# Patient Record
Sex: Male | Born: 1965 | Race: White | Hispanic: No | Marital: Single
Health system: Southern US, Community
[De-identification: ages and names within clinical notes are randomized; demographics above are authoritative.]

---

## 2004-03-07 ENCOUNTER — Other Ambulatory Visit: Payer: Self-pay

## 2006-02-22 ENCOUNTER — Other Ambulatory Visit: Payer: Self-pay

## 2006-02-22 ENCOUNTER — Emergency Department: Payer: Self-pay | Admitting: Emergency Medicine

## 2008-08-05 ENCOUNTER — Emergency Department: Payer: Self-pay | Admitting: Emergency Medicine

## 2013-01-28 ENCOUNTER — Emergency Department: Payer: Self-pay | Admitting: Emergency Medicine

## 2013-01-28 LAB — URINALYSIS, COMPLETE
Ketone: NEGATIVE
Leukocyte Esterase: NEGATIVE
Nitrite: NEGATIVE
Ph: 5 (ref 4.5–8.0)
Protein: NEGATIVE
RBC,UR: 1 /HPF (ref 0–5)
Specific Gravity: >1.06 (ref 1.003–1.030)
Squamous Epithelial: <1
WBC UR: <1 /HPF (ref 0–5)

## 2013-01-28 LAB — CBC
HCT: 45.3 % (ref 40.0–52.0)
Platelet: 324 10*3/uL (ref 150–440)
RBC: 4.93 10*6/uL (ref 4.40–5.90)
WBC: 13.3 10*3/uL — ABNORMAL HIGH (ref 3.8–10.6)

## 2013-01-28 LAB — COMPREHENSIVE METABOLIC PANEL
Albumin: 4.1 g/dL (ref 3.4–5.0)
BUN: 11 mg/dL (ref 7–18)
Bilirubin,Total: 0.4 mg/dL (ref 0.2–1.0)
Calcium, Total: 8.8 mg/dL (ref 8.5–10.1)
Creatinine: 0.85 mg/dL (ref 0.60–1.30)
EGFR (Non-African Amer.): >60
Glucose: 96 mg/dL (ref 65–99)
Osmolality: 275 (ref 275–301)
Potassium: 3.6 mmol/L (ref 3.5–5.1)
SGPT (ALT): 76 U/L (ref 12–78)
Sodium: 138 mmol/L (ref 136–145)
Total Protein: 8.2 g/dL (ref 6.4–8.2)

## 2013-01-28 LAB — LIPASE, BLOOD: Lipase: 217 U/L (ref 73–393)

## 2013-01-28 LAB — TROPONIN I
Troponin-I: <0.02 ng/mL
Troponin-I: <0.02 ng/mL

## 2013-01-28 LAB — DRUG SCREEN, URINE
Amphetamines, Ur Screen: NEGATIVE (ref ?–1000)
Barbiturates, Ur Screen: NEGATIVE (ref ?–200)
Cannabinoid 50 Ng, Ur ~~LOC~~: POSITIVE (ref ?–50)
Cocaine Metabolite,Ur ~~LOC~~: NEGATIVE (ref ?–300)
MDMA (Ecstasy)Ur Screen: NEGATIVE (ref ?–500)
Methadone, Ur Screen: NEGATIVE (ref ?–300)
Phencyclidine (PCP) Ur S: NEGATIVE (ref ?–25)

## 2013-01-28 LAB — PROTIME-INR: INR: 0.9

## 2013-01-28 LAB — CK TOTAL AND CKMB (NOT AT ARMC): CK-MB: 3.8 ng/mL — ABNORMAL HIGH (ref 0.5–3.6)

## 2014-03-11 IMAGING — CR DG CHEST 2V
1 series · 2 of 2 positions shown · non-contrast
Comparison: none

REASON FOR EXAM: cp
COMMENTS:   May transport without cardiac monitor

[Series 1: w chest pa · 0.14mm/px · 2 of 2 slices shown]
[im 1/2]
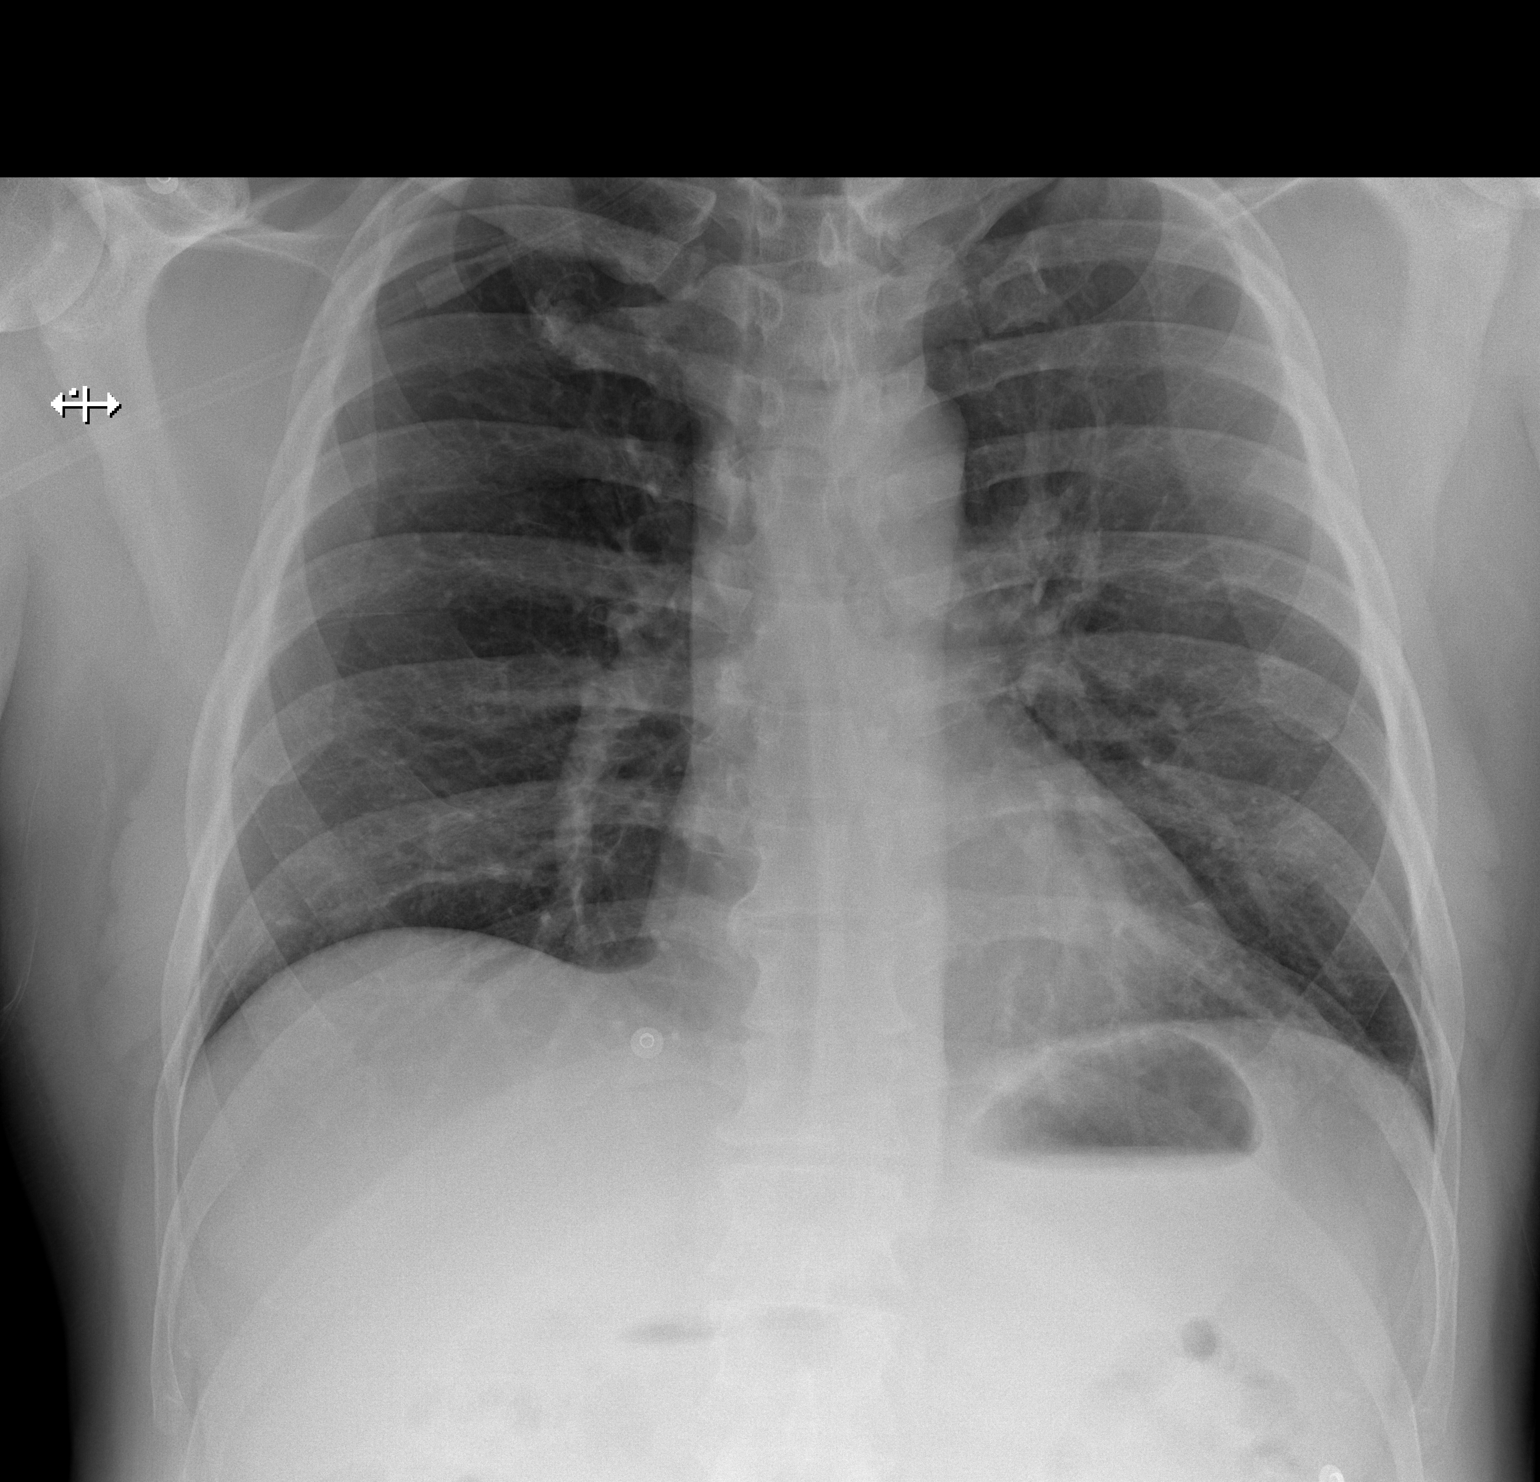
[im 2/2]
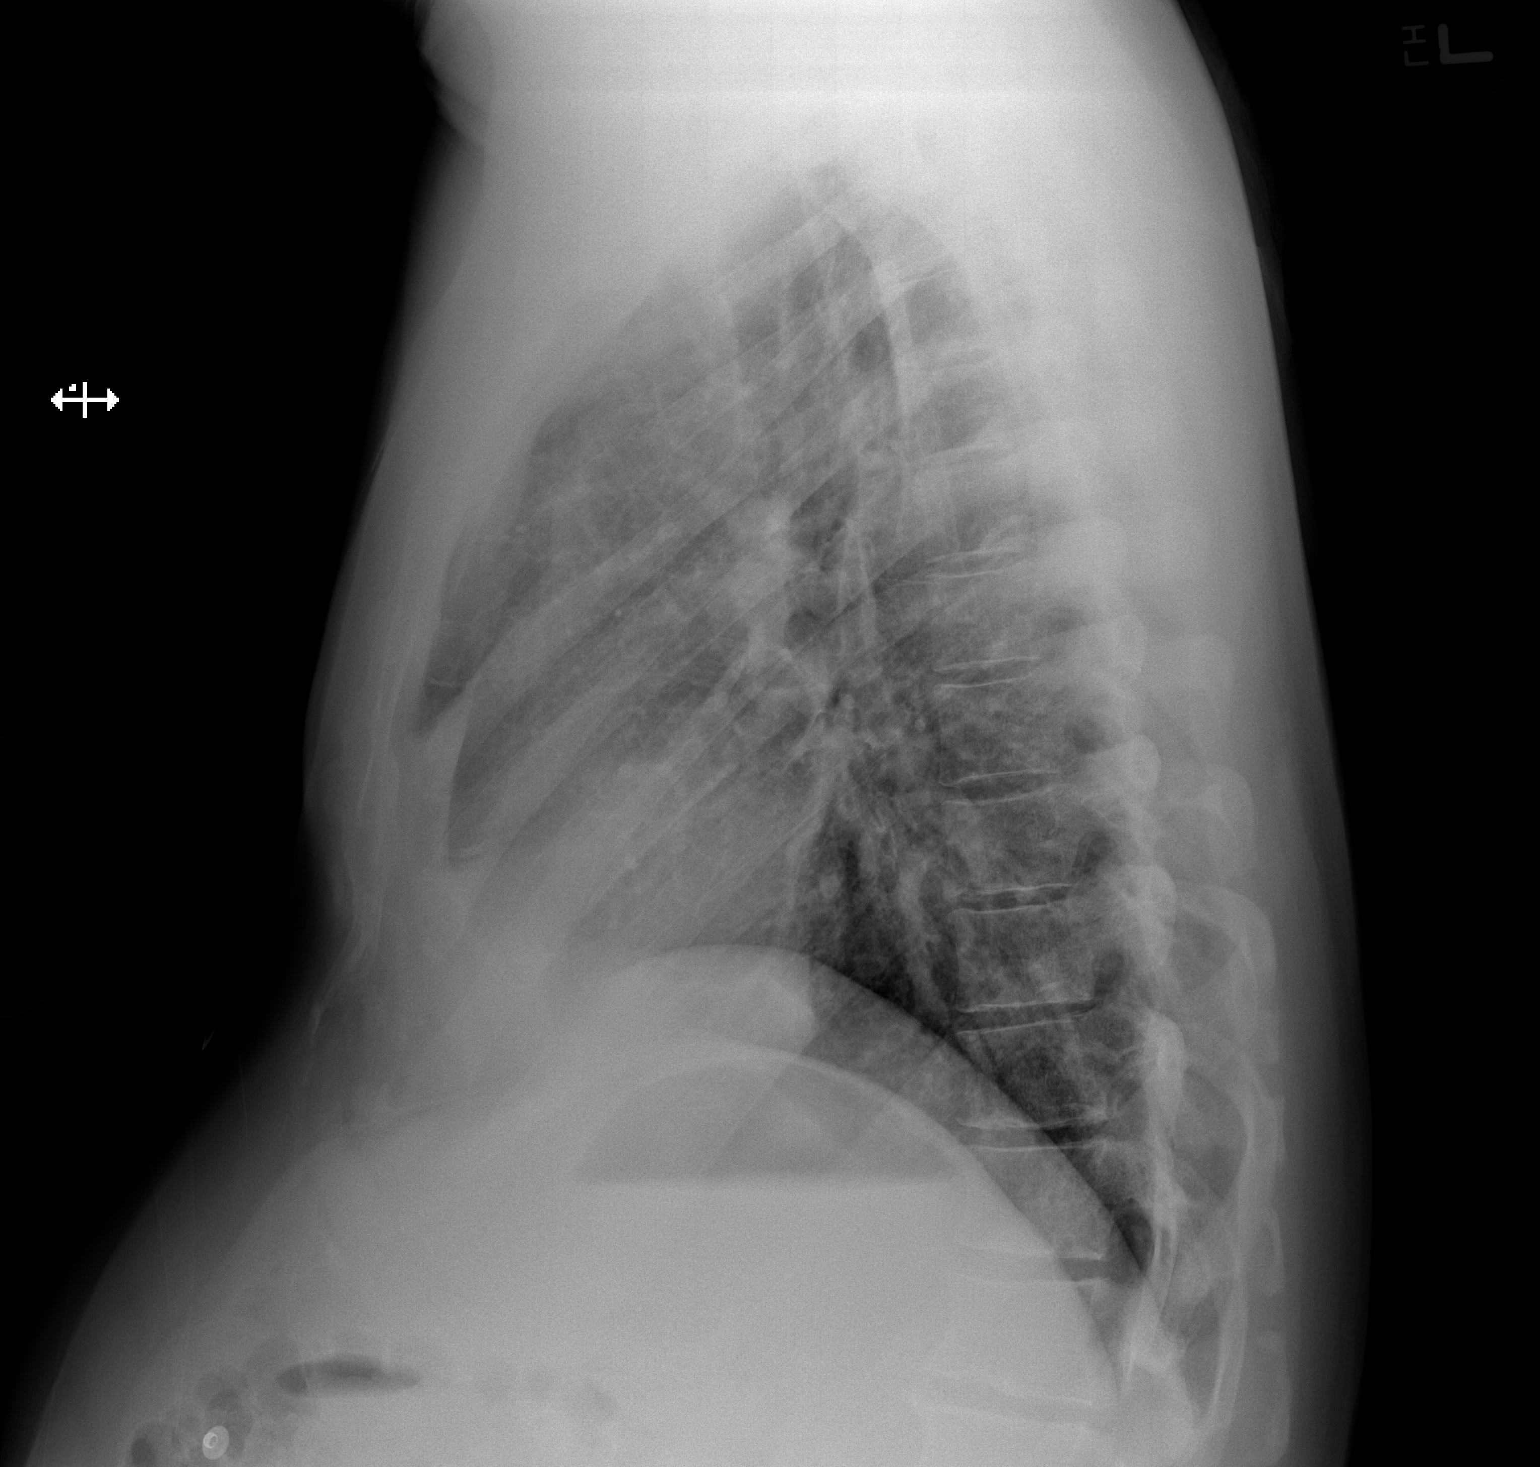

[2 of 2 positions shown; findings below may reference images not displayed]

PROCEDURE:     DXR - DXR CHEST PA (OR AP) AND LATERAL  - January 28, 2013  [DATE]

RESULT:     Oxygen tubing projects over the right upper lobe. The lungs are
clear. The heart and pulmonary vessels are normal. The bony and mediastinal
structures are unremarkable. There is no effusion. There is no pneumothorax
or evidence of congestive failure.
IMPRESSION: No acute cardiopulmonary disease.

[REDACTED]

## 2014-03-11 IMAGING — CT CT CHEST W/ CM
2 of 5 series · 16 of 31 positions shown, 19 images · non-contrast
Comparison: none

REASON FOR EXAM: severe cp rad to back w hi bp
COMMENTS:

[Series 8: [place] · axial · 0.85mm/px · z∈[+725,+939]mm · 12 of 367 slices shown, 15 images (1 of 2)]
[im 31/367  mediastinal]
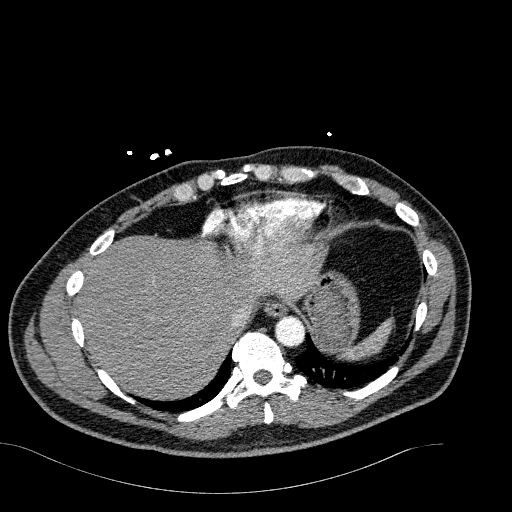
[im 31/367  lung]
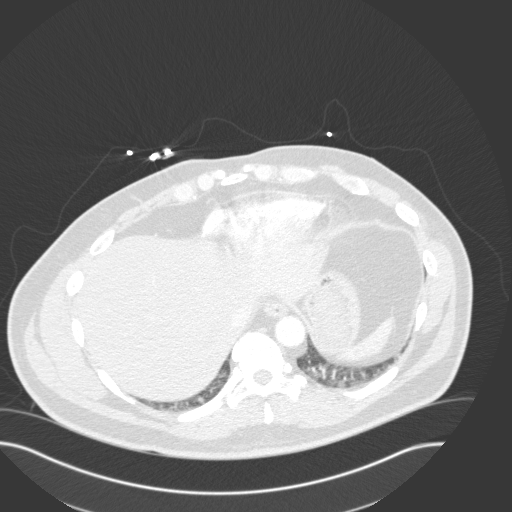
[im 62/367  lung]
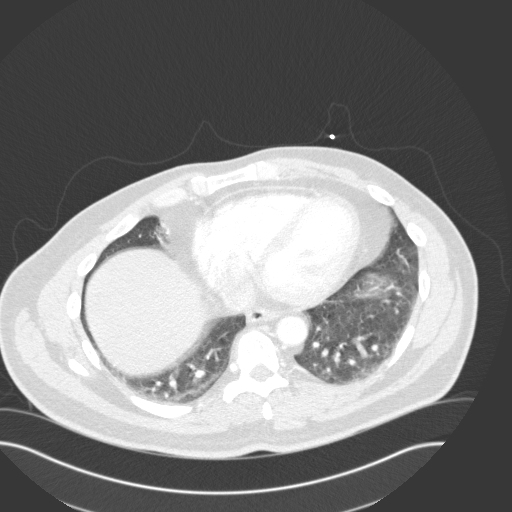
[im 71/367  lung]
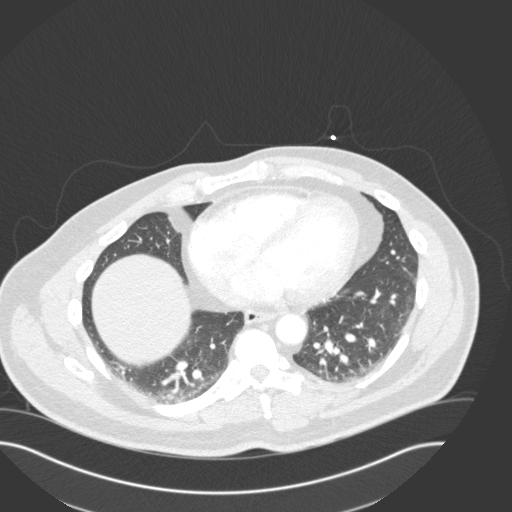
[im 92/367  lung]
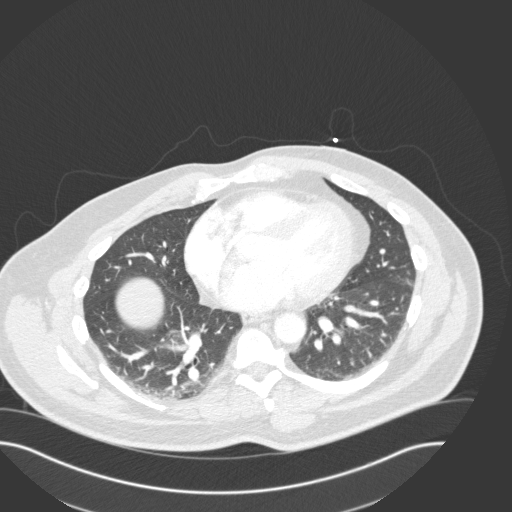
[im 123/367  mediastinal]
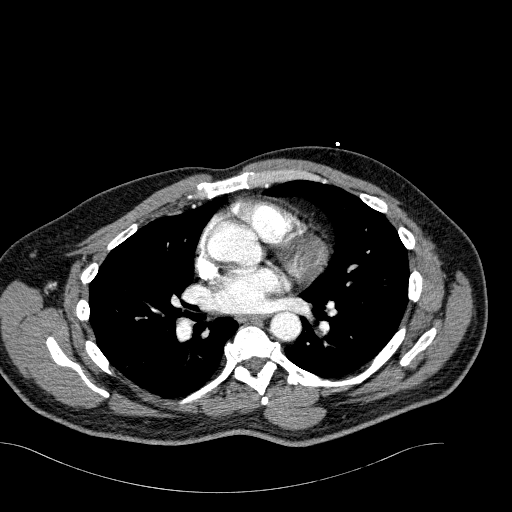
[im 123/367  lung]
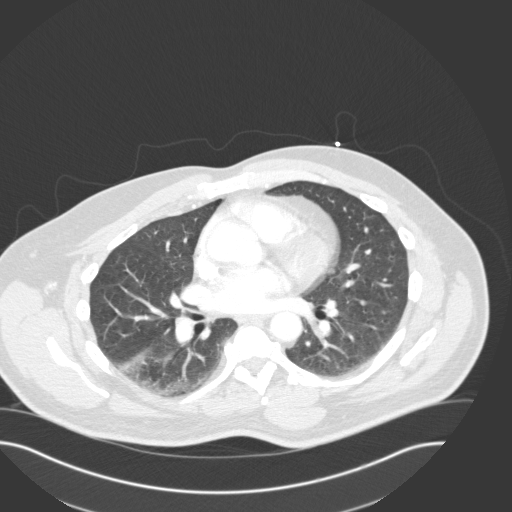
[im 153/367  lung]
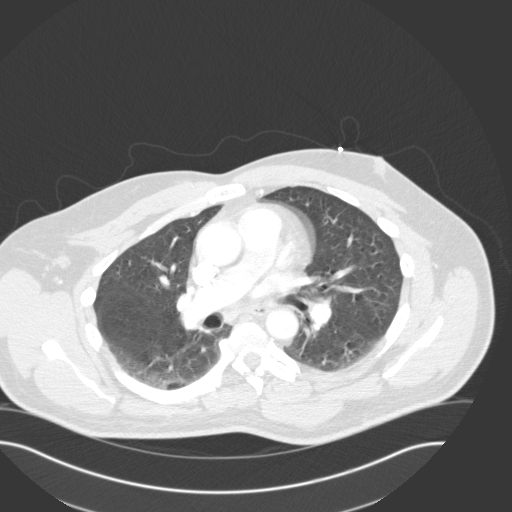
[im 184/367  lung]
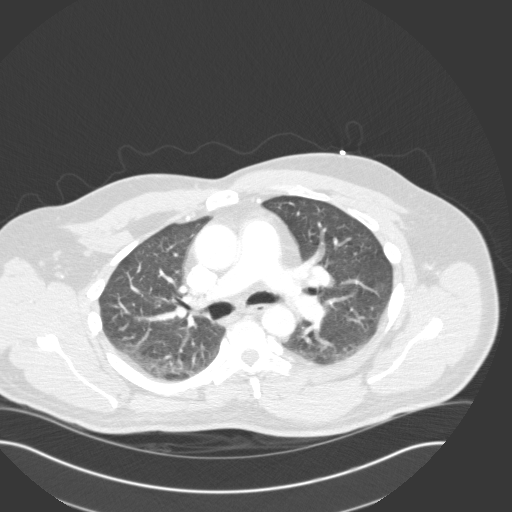
[im 214/367  lung]
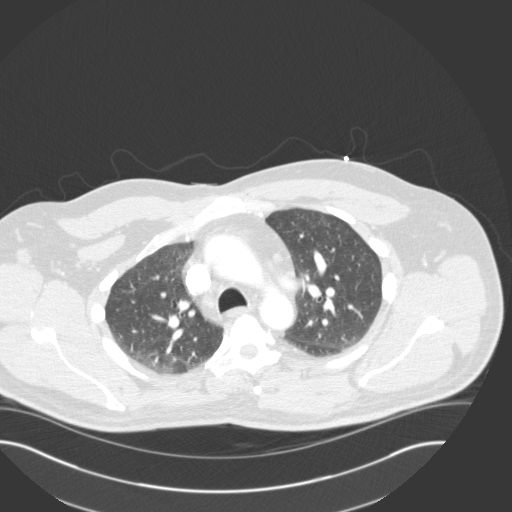
[im 245/367  mediastinal]
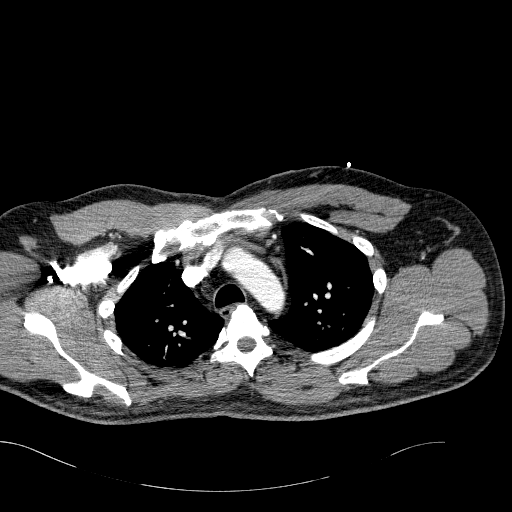
[im 245/367  lung]
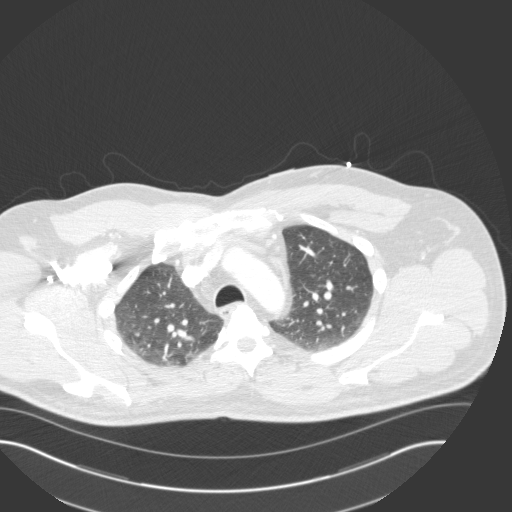
[im 275/367  lung]
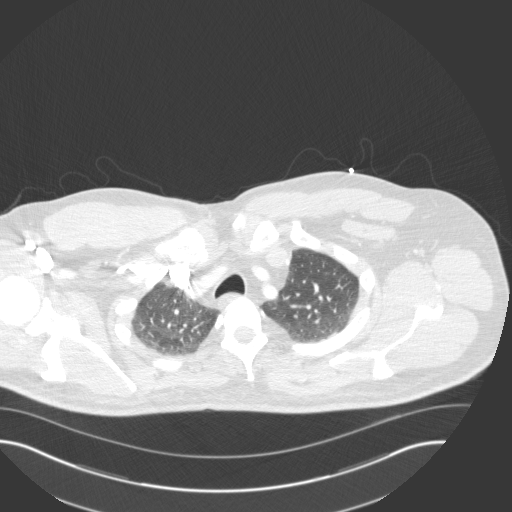
[im 306/367  lung]
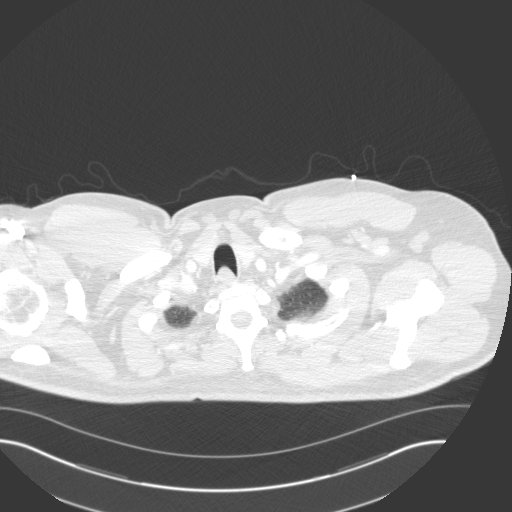
[im 336/367  lung]
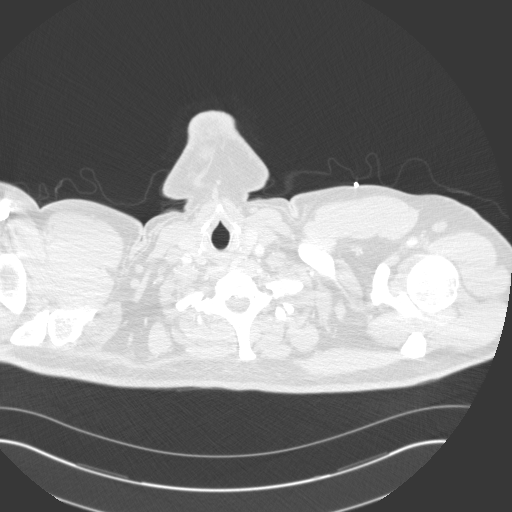

[Series 11: [place] · axial · 0.85mm/px · z∈[+588,+661]mm · 4 of 207 slices shown (2 of 2)]
[im 35/207  lung]
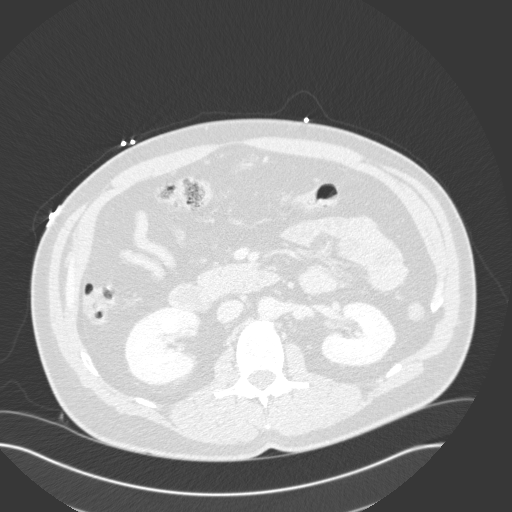
[im 69/207  lung]
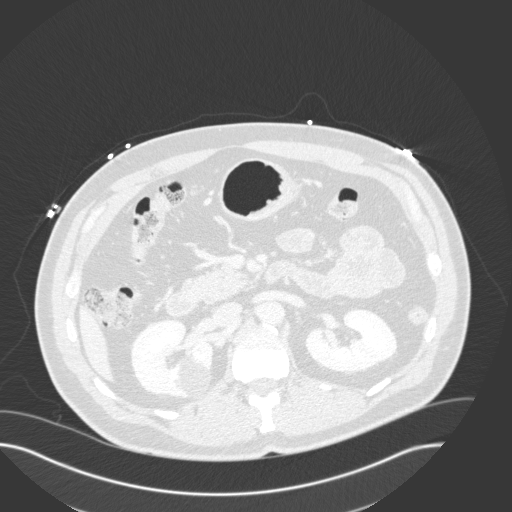
[im 104/207  lung]
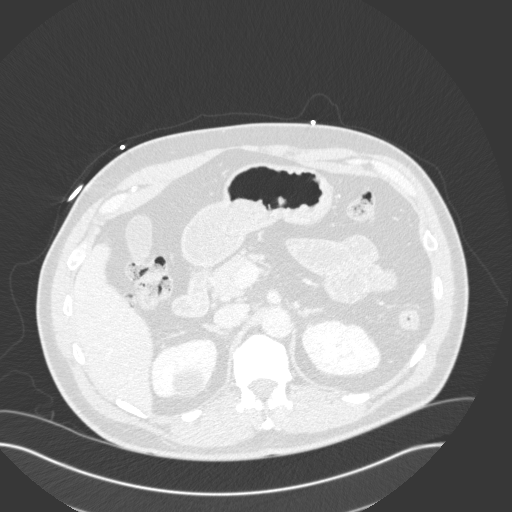
[im 138/207  lung]
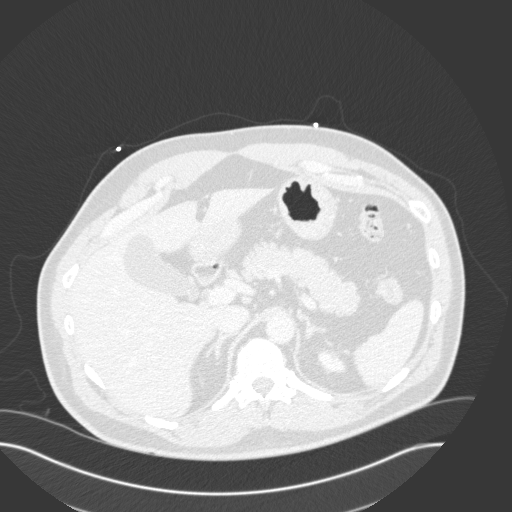

[16 of 31 positions shown; findings below may reference images not displayed]

PROCEDURE:     CT  - CT CHEST (FOR PE) W  - January 28, 2013  [DATE]

RESULT:     Chest CT is performed utilizing a CTA pulmonary angiogram
protocol with images reconstructed at 3 mm slice thickness in the axial
plane. The thoracic aorta is normal in caliber without evidence of
dissection. The pulmonary arterial system is well opacified with no
demonstrated filling defect to suggest pulmonary embolism. The there is
respiratory motion artifact with dependent atelectasis. There is no definite
infiltrate. No endobronchial lesion or pneumothorax is present. The images
demonstrate no mediastinal or hilar mass or adenopathy. The thyroid lobes
included on the study appear unremarkable. There is no discrete pulmonary
mass. There is some minimal thickening along the major fissure on the left
side on image 56 measuring approximately 3.0 to 3.5 mm with a smaller area
more laterally measuring approximately 2 to 2.5 mm.
IMPRESSION: 1. No thoracic aortic aneurysm or dissection.
2. No pleural or pericardial effusion.
3. No pulmonary embolic filling defect.
4. Minimal thickening along the major fissure on the left.

[REDACTED]

## 2020-11-27 ENCOUNTER — Other Ambulatory Visit: Payer: Self-pay

## 2020-11-27 ENCOUNTER — Emergency Department
Admission: EM | Admit: 2020-11-27 | Discharge: 2020-11-28 | Disposition: A | Discharge status: Home / Self Care | Payer: Self-pay | Attending: Emergency Medicine | Admitting: Emergency Medicine

## 2020-11-27 DIAGNOSIS — H1013 Acute atopic conjunctivitis, bilateral: Secondary | ICD-10-CM | POA: Insufficient documentation

## 2020-11-27 NOTE — ED Triage Notes (Signed)
Pt here for eyes burning bilat, pt unsure of cause. Pt states he is having allergic reaction but has no other symptoms.

## 2020-11-28 MED ORDER — OLOPATADINE HCL 0.1 % OP SOLN
1.0000 [drp] | Freq: Once | OPHTHALMIC | Status: AC
  Administered 2020-11-28: 1 [drp] via OPHTHALMIC
  Filled 2020-11-28: qty 5

## 2020-11-28 NOTE — ED Provider Notes (Signed)
[facility] Emergency Department Provider Note  ____________________________________________  Time seen: Approximately 12:53 AM  I have reviewed the triage vital signs and the nursing notes.   HISTORY  Chief Complaint Burning Eyes   HPI Jose Lynch is a 55 y.o. male who presents for evaluation of bilateral eye burning and pain.  Patient reports that he was in the backyard today playing with his dogs and walking around a lot of areas that are full of poison ivy.  He was grabbing sticks and  throwing for his dogs.  After going back inside patient started having burning on bilateral eyes.  He try to get in the shower and run some water in his eyes with no relief.  He then applied some Visine with also did not help.  Is complaining of severe burning on both eyes and photophobia.  He denies any other environmental exposures like fumes, paints, welding.  He does not use contact lenses.  He denies any previous eye surgeries or eyedrops.  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  PMH None - reviewed  Allergies Poison Ivy  No family history on file.  Social History  Smokers - yes Alcohol - yes  Review of Systems  Constitutional: Negative for fever. Eyes: + Bilateral eye burning and blurry vision ENT: Negative for sore throat. Neck: No neck pain  Cardiovascular: Negative for chest pain. Respiratory: Negative for shortness of breath. Gastrointestinal: Negative for abdominal pain, vomiting or diarrhea. Genitourinary: Negative for dysuria. Musculoskeletal: Negative for back pain. Skin: Negative for rash. Neurological: Negative for headaches, weakness or numbness. Psych: No SI or HI  ____________________________________________   PHYSICAL EXAM:  VITAL SIGNS: ED Triage Vitals [11/27/20 2332]  Enc Vitals Group     BP (!) 174/105     Pulse Rate 83     Resp 18     Temp (!) 97.5 F (36.4 C)     Temp Source Oral      SpO2 100 %     Weight 230 lb (104.3 kg)     Height 6' (1.829 m)     Head Circumference      Peak Flow      Pain Score 10     Pain Loc      Pain Edu?      Excl. in GC?     Constitutional: Alert and oriented. Well appearing and in no apparent distress. HEENT:      Head: Normocephalic and atraumatic.         Eyes: Photophobia with injected conjunctiva bilaterally, pupils are equal round and reactive, intact extraocular movements, IOP of 18 bilaterally, no uptake of fluorescein with Woods lamp, no foreign bodies. Visual acuity 20/30      mouth/Throat: Mucous membranes are moist.       Neck: Supple with no signs of meningismus. Cardiovascular: Regular rate and rhythm.  Respiratory: Normal respiratory effort.  Musculoskeletal:  No edema, cyanosis, or erythema of extremities. Neurologic: Normal speech and language. Face is symmetric. Moving all extremities. No gross focal neurologic deficits are appreciated. Skin: Skin is warm, dry and intact. No rash noted. Psychiatric: Mood and affect are normal. Speech and behavior are normal.  ____________________________________________   LABS (all labs ordered are listed, but only abnormal results are displayed)  Labs Reviewed - No data to display ____________________________________________  EKG  none  ____________________________________________  RADIOLOGY  none  ____________________________________________   PROCEDURES  Procedure(s) performed: None Procedures Critical Care  performed:  None ____________________________________________   INITIAL IMPRESSION / ASSESSMENT AND PLAN / ED COURSE  56 y.o. male who presents for evaluation of bilateral eye burning and pain.  Presentation consistent with allergic conjunctivitis possibly from poison ivy or pollen.  No obvious foreign bodies, no corneal abrasion, no signs of acute angle-closure glaucoma with normal IOP.  After tetracaine application patient's pain improved significantly.  I  was able to wash his eyes with saline and apply olopatadine.  Will discharge home with daily applications and follow-up with ophthalmology.  Discussed my standard return precautions.      _____________________________________________ Please note:  Patient was evaluated in Emergency Department today for the symptoms described in the history of present illness. Patient was evaluated in the context of the global COVID-19 pandemic, which necessitated consideration that the patient might be at risk for infection with the SARS-CoV-2 virus that causes COVID-19. Institutional protocols and algorithms that pertain to the evaluation of patients at risk for COVID-19 are in a state of rapid change based on information released by regulatory bodies including the CDC and federal and state organizations. These policies and algorithms were followed during the patient's care in the ED.  Some ED evaluations and interventions may be delayed as a result of limited staffing during the pandemic.   [place] Controlled Substance Database was reviewed by me. ____________________________________________   FINAL CLINICAL IMPRESSION(S) / ED DIAGNOSES   Final diagnoses:  Allergic conjunctivitis of both eyes      NEW MEDICATIONS STARTED DURING THIS VISIT:  ED Discharge Orders    None       Note:  This document was prepared using Dragon voice recognition software and may include unintentional dictation errors.    Nita Sickle, MD 11/28/20 [phone]
